# Patient Record
Sex: Female | Born: 1945 | Race: White | Hispanic: No | Marital: Married | Smoking: Current every day smoker
Health system: Southern US, Community
[De-identification: ages and names within clinical notes are randomized; demographics above are authoritative.]

---

## 2013-12-11 ENCOUNTER — Encounter (HOSPITAL_COMMUNITY): Payer: Self-pay | Admitting: Emergency Medicine

## 2013-12-11 ENCOUNTER — Emergency Department (HOSPITAL_COMMUNITY)
Admission: EM | Admit: 2013-12-11 | Discharge: 2013-12-11 | Disposition: A | Discharge status: Home / Self Care | Payer: Medicare Other | Attending: Emergency Medicine | Admitting: Emergency Medicine

## 2013-12-11 ENCOUNTER — Emergency Department (HOSPITAL_COMMUNITY): Payer: Medicare Other

## 2013-12-11 DIAGNOSIS — Z880 Allergy status to penicillin: Secondary | ICD-10-CM | POA: Insufficient documentation

## 2013-12-11 DIAGNOSIS — S060X9A Concussion with loss of consciousness of unspecified duration, initial encounter: Secondary | ICD-10-CM | POA: Insufficient documentation

## 2013-12-11 DIAGNOSIS — Y92410 Unspecified street and highway as the place of occurrence of the external cause: Secondary | ICD-10-CM | POA: Insufficient documentation

## 2013-12-11 DIAGNOSIS — F17200 Nicotine dependence, unspecified, uncomplicated: Secondary | ICD-10-CM | POA: Insufficient documentation

## 2013-12-11 DIAGNOSIS — IMO0002 Reserved for concepts with insufficient information to code with codable children: Secondary | ICD-10-CM | POA: Insufficient documentation

## 2013-12-11 DIAGNOSIS — S060XAA Concussion with loss of consciousness status unknown, initial encounter: Secondary | ICD-10-CM

## 2013-12-11 DIAGNOSIS — Y9389 Activity, other specified: Secondary | ICD-10-CM | POA: Insufficient documentation

## 2013-12-11 DIAGNOSIS — R413 Other amnesia: Secondary | ICD-10-CM | POA: Insufficient documentation

## 2013-12-11 LAB — I-STAT CHEM 8, ED
BUN: 12 mg/dL (ref 6–23)
CALCIUM ION: 1.14 mmol/L (ref 1.13–1.30)
Chloride: 105 mEq/L (ref 96–112)
Creatinine, Ser: 0.8 mg/dL (ref 0.50–1.10)
Glucose, Bld: 103 mg/dL — ABNORMAL HIGH (ref 70–99)
HEMATOCRIT: 45 % (ref 36.0–46.0)
Hemoglobin: 15.3 g/dL — ABNORMAL HIGH (ref 12.0–15.0)
Potassium: 4.3 mEq/L (ref 3.7–5.3)
Sodium: 142 mEq/L (ref 137–147)
TCO2: 23 mmol/L (ref 0–100)

## 2013-12-11 LAB — I-STAT TROPONIN, ED: TROPONIN I, POC: 0 ng/mL (ref 0.00–0.08)

## 2013-12-11 MED ORDER — TRAMADOL HCL 50 MG PO TABS: 50.0000 mg | ORAL_TABLET | Freq: Four times a day (QID) | ORAL | Status: AC | PRN
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
## 2013-12-11 NOTE — ED Notes (Signed)
Dr. Knapp back at the bedside.  
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
## 2013-12-11 NOTE — ED Notes (Signed)
C-collar removed by Dr. Knapp. 

## 2013-12-11 NOTE — ED Notes (Signed)
Pt transported to CT ?
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
## 2013-12-11 NOTE — Discharge Instructions (Signed)

## 2013-12-11 NOTE — ED Notes (Signed)
Pt returned from CT °
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
